# Patient Record
Sex: Female | Born: 1985 | Race: White | Hispanic: No | Marital: Single | Smoking: Never smoker
Health system: Southern US, Community
[De-identification: ages and names within clinical notes are randomized; demographics above are authoritative.]

## PROBLEM LIST (undated history)

## (undated) HISTORY — PX: CHOLECYSTECTOMY: SHX55

---

## 2011-04-03 ENCOUNTER — Emergency Department: Payer: Self-pay | Admitting: Emergency Medicine

## 2011-04-03 LAB — COMPREHENSIVE METABOLIC PANEL
Albumin: 4.2 g/dL (ref 3.4–5.0)
BUN: 15 mg/dL (ref 7–18)
Chloride: 105 mmol/L (ref 98–107)
Co2: 25 mmol/L (ref 21–32)
Creatinine: 0.71 mg/dL (ref 0.60–1.30)
EGFR (African American): >60
EGFR (Non-African Amer.): >60
Glucose: 92 mg/dL (ref 65–99)
Osmolality: 280 (ref 275–301)
SGOT(AST): 20 U/L (ref 15–37)
SGPT (ALT): 18 U/L
Total Protein: 7.8 g/dL (ref 6.4–8.2)

## 2011-04-03 LAB — CBC
HCT: 39.3 % (ref 35.0–47.0)
HGB: 13.2 g/dL (ref 12.0–16.0)
MCHC: 33.6 g/dL (ref 32.0–36.0)
Platelet: 199 10*3/uL (ref 150–440)
RBC: 4.94 10*6/uL (ref 3.80–5.20)

## 2011-04-03 LAB — TROPONIN I: Troponin-I: <0.02 ng/mL

## 2011-04-03 LAB — PREGNANCY, URINE: Pregnancy Test, Urine: NEGATIVE m[IU]/mL

## 2011-04-03 LAB — URINALYSIS, COMPLETE
Bilirubin,UR: NEGATIVE
Ketone: NEGATIVE
Nitrite: NEGATIVE
Ph: 5 (ref 4.5–8.0)
Protein: NEGATIVE
RBC,UR: NONE SEEN /HPF (ref 0–5)
Specific Gravity: 1.027 (ref 1.003–1.030)
WBC UR: NONE SEEN /HPF (ref 0–5)

## 2011-04-07 ENCOUNTER — Ambulatory Visit: Payer: Self-pay

## 2011-04-07 LAB — BASIC METABOLIC PANEL
Anion Gap: 9 (ref 7–16)
BUN: 12 mg/dL (ref 7–18)
Calcium, Total: 9.3 mg/dL (ref 8.5–10.1)
Chloride: 103 mmol/L (ref 98–107)
Co2: 28 mmol/L (ref 21–32)
Creatinine: 0.94 mg/dL (ref 0.60–1.30)
EGFR (African American): >60
EGFR (Non-African Amer.): >60
Glucose: 87 mg/dL (ref 65–99)
Osmolality: 279 (ref 275–301)
Potassium: 4.1 mmol/L (ref 3.5–5.1)
Sodium: 140 mmol/L (ref 136–145)

## 2011-04-22 ENCOUNTER — Ambulatory Visit: Payer: Self-pay | Admitting: Internal Medicine

## 2013-01-16 ENCOUNTER — Ambulatory Visit: Payer: Self-pay | Admitting: Physician Assistant

## 2018-02-08 DIAGNOSIS — R509 Fever, unspecified: Secondary | ICD-10-CM | POA: Diagnosis not present

## 2018-02-08 DIAGNOSIS — J101 Influenza due to other identified influenza virus with other respiratory manifestations: Secondary | ICD-10-CM | POA: Diagnosis not present

## 2018-02-11 DIAGNOSIS — B9789 Other viral agents as the cause of diseases classified elsewhere: Secondary | ICD-10-CM | POA: Diagnosis not present

## 2018-02-11 DIAGNOSIS — J069 Acute upper respiratory infection, unspecified: Secondary | ICD-10-CM | POA: Diagnosis not present

## 2018-02-19 DIAGNOSIS — Z8759 Personal history of other complications of pregnancy, childbirth and the puerperium: Secondary | ICD-10-CM | POA: Diagnosis not present

## 2018-02-19 DIAGNOSIS — Z3A01 Less than 8 weeks gestation of pregnancy: Secondary | ICD-10-CM | POA: Diagnosis not present

## 2018-02-19 DIAGNOSIS — O3680X0 Pregnancy with inconclusive fetal viability, not applicable or unspecified: Secondary | ICD-10-CM | POA: Diagnosis not present

## 2018-02-19 DIAGNOSIS — Z87891 Personal history of nicotine dependence: Secondary | ICD-10-CM | POA: Diagnosis not present

## 2018-03-10 DIAGNOSIS — F419 Anxiety disorder, unspecified: Secondary | ICD-10-CM | POA: Diagnosis not present

## 2018-03-10 DIAGNOSIS — S39011A Strain of muscle, fascia and tendon of abdomen, initial encounter: Secondary | ICD-10-CM | POA: Diagnosis not present

## 2018-03-19 DIAGNOSIS — O09299 Supervision of pregnancy with other poor reproductive or obstetric history, unspecified trimester: Secondary | ICD-10-CM | POA: Diagnosis not present

## 2018-03-19 DIAGNOSIS — Z3481 Encounter for supervision of other normal pregnancy, first trimester: Secondary | ICD-10-CM | POA: Diagnosis not present

## 2018-03-23 DIAGNOSIS — O285 Abnormal chromosomal and genetic finding on antenatal screening of mother: Secondary | ICD-10-CM | POA: Diagnosis not present

## 2018-03-24 DIAGNOSIS — Z36 Encounter for antenatal screening for chromosomal anomalies: Secondary | ICD-10-CM | POA: Diagnosis not present

## 2018-03-24 DIAGNOSIS — Z3A12 12 weeks gestation of pregnancy: Secondary | ICD-10-CM | POA: Diagnosis not present

## 2018-03-24 DIAGNOSIS — O09291 Supervision of pregnancy with other poor reproductive or obstetric history, first trimester: Secondary | ICD-10-CM | POA: Diagnosis not present

## 2018-04-07 DIAGNOSIS — F419 Anxiety disorder, unspecified: Secondary | ICD-10-CM | POA: Diagnosis not present

## 2018-05-05 DIAGNOSIS — Z363 Encounter for antenatal screening for malformations: Secondary | ICD-10-CM | POA: Diagnosis not present

## 2018-05-05 DIAGNOSIS — Z3482 Encounter for supervision of other normal pregnancy, second trimester: Secondary | ICD-10-CM | POA: Diagnosis not present

## 2018-07-12 DIAGNOSIS — Z23 Encounter for immunization: Secondary | ICD-10-CM | POA: Diagnosis not present

## 2018-07-12 DIAGNOSIS — Z3480 Encounter for supervision of other normal pregnancy, unspecified trimester: Secondary | ICD-10-CM | POA: Diagnosis not present

## 2018-07-29 DIAGNOSIS — D563 Thalassemia minor: Secondary | ICD-10-CM | POA: Diagnosis not present

## 2018-07-29 DIAGNOSIS — Z87891 Personal history of nicotine dependence: Secondary | ICD-10-CM | POA: Diagnosis not present

## 2018-07-29 DIAGNOSIS — O99013 Anemia complicating pregnancy, third trimester: Secondary | ICD-10-CM | POA: Diagnosis not present

## 2018-07-29 DIAGNOSIS — O26893 Other specified pregnancy related conditions, third trimester: Secondary | ICD-10-CM | POA: Diagnosis not present

## 2018-07-29 DIAGNOSIS — Z3A30 30 weeks gestation of pregnancy: Secondary | ICD-10-CM | POA: Diagnosis not present

## 2018-07-30 DIAGNOSIS — O26893 Other specified pregnancy related conditions, third trimester: Secondary | ICD-10-CM | POA: Diagnosis not present

## 2018-07-30 DIAGNOSIS — O99013 Anemia complicating pregnancy, third trimester: Secondary | ICD-10-CM | POA: Diagnosis not present

## 2018-07-30 DIAGNOSIS — Z3A30 30 weeks gestation of pregnancy: Secondary | ICD-10-CM | POA: Diagnosis not present

## 2018-07-30 DIAGNOSIS — Z87891 Personal history of nicotine dependence: Secondary | ICD-10-CM | POA: Diagnosis not present

## 2018-07-30 DIAGNOSIS — O4703 False labor before 37 completed weeks of gestation, third trimester: Secondary | ICD-10-CM | POA: Diagnosis not present

## 2018-07-30 DIAGNOSIS — D563 Thalassemia minor: Secondary | ICD-10-CM | POA: Diagnosis not present

## 2018-09-09 DIAGNOSIS — O26843 Uterine size-date discrepancy, third trimester: Secondary | ICD-10-CM | POA: Diagnosis not present

## 2018-09-14 DIAGNOSIS — Z3483 Encounter for supervision of other normal pregnancy, third trimester: Secondary | ICD-10-CM | POA: Diagnosis not present

## 2018-09-15 DIAGNOSIS — Z3A37 37 weeks gestation of pregnancy: Secondary | ICD-10-CM | POA: Diagnosis not present

## 2018-09-20 DIAGNOSIS — Z23 Encounter for immunization: Secondary | ICD-10-CM | POA: Diagnosis not present

## 2018-09-28 DIAGNOSIS — Z20828 Contact with and (suspected) exposure to other viral communicable diseases: Secondary | ICD-10-CM | POA: Diagnosis not present

## 2018-09-28 DIAGNOSIS — Z1159 Encounter for screening for other viral diseases: Secondary | ICD-10-CM | POA: Diagnosis not present

## 2018-09-28 DIAGNOSIS — Z01812 Encounter for preprocedural laboratory examination: Secondary | ICD-10-CM | POA: Diagnosis not present

## 2018-10-01 DIAGNOSIS — Z3A39 39 weeks gestation of pregnancy: Secondary | ICD-10-CM | POA: Diagnosis not present

## 2018-10-06 DIAGNOSIS — F410 Panic disorder [episodic paroxysmal anxiety] without agoraphobia: Secondary | ICD-10-CM | POA: Diagnosis not present

## 2018-10-14 DIAGNOSIS — F419 Anxiety disorder, unspecified: Secondary | ICD-10-CM | POA: Diagnosis not present

## 2018-10-14 DIAGNOSIS — O864 Pyrexia of unknown origin following delivery: Secondary | ICD-10-CM | POA: Diagnosis not present

## 2018-10-15 DIAGNOSIS — F419 Anxiety disorder, unspecified: Secondary | ICD-10-CM | POA: Diagnosis not present

## 2018-10-15 DIAGNOSIS — O8612 Endometritis following delivery: Secondary | ICD-10-CM | POA: Diagnosis not present

## 2018-10-16 DIAGNOSIS — F419 Anxiety disorder, unspecified: Secondary | ICD-10-CM | POA: Diagnosis not present

## 2018-10-16 DIAGNOSIS — O8612 Endometritis following delivery: Secondary | ICD-10-CM | POA: Diagnosis not present

## 2019-04-08 DIAGNOSIS — M545 Low back pain: Secondary | ICD-10-CM | POA: Diagnosis not present

## 2019-10-15 ENCOUNTER — Ambulatory Visit
Admission: EM | Admit: 2019-10-15 | Discharge: 2019-10-15 | Disposition: A | Discharge status: Home / Self Care | Payer: Medicaid Other | Attending: Family Medicine | Admitting: Family Medicine

## 2019-10-15 ENCOUNTER — Ambulatory Visit (INDEPENDENT_AMBULATORY_CARE_PROVIDER_SITE_OTHER): Payer: Medicaid Other

## 2019-10-15 ENCOUNTER — Other Ambulatory Visit: Payer: Self-pay

## 2019-10-15 DIAGNOSIS — S8992XA Unspecified injury of left lower leg, initial encounter: Secondary | ICD-10-CM

## 2019-10-15 DIAGNOSIS — M25562 Pain in left knee: Secondary | ICD-10-CM | POA: Diagnosis not present

## 2019-10-15 MED ORDER — MELOXICAM 15 MG PO TABS: 15.0000 mg | ORAL_TABLET | Freq: Every day | ORAL | 0 refills | Status: DC | PRN

## 2019-10-15 NOTE — Discharge Instructions (Signed)
Rest, ice, elevation.  Medication as directed.  Please call [facility] clinic Orthopedics [phone]) OR [facility] [phone]) for an appt if you fail to improve or worsen. May need MRI.  Take care  Dr. Adriana Simas

## 2019-10-15 NOTE — ED Provider Notes (Signed)
[facility]    CSN: [id number] Arrival date & time: 10/15/19  1518  History   Chief Complaint Chief Complaint  Patient presents with  . Knee Pain    left   HPI  34 year old female presents with left knee pain.  Patient states that she was at a trampoline park today with her children. Was jumping and came down forcefully and heard a pop of the left knee. Then developed pain (diffuse) and difficulty bearing weight. Patient reports feeling as if knee is unstable. No relieving factors. Pain 6/10 in severity. No other associated symptoms.   Home Medications    Prior to Admission medications   Medication Sig Start Date End Date Taking? Authorizing Provider  sertraline (ZOLOFT) 100 MG tablet Take 100 mg by mouth daily. 10/05/19  Yes [provider]  meloxicam (MOBIC) 15 MG tablet Take 1 tablet (15 mg total) by mouth daily as needed. 10/15/19   Tommie Sams, DO    Family History Family History  Problem Relation Age of Onset  . Rheum arthritis Mother   . Fibromyalgia Mother   . Anxiety disorder Mother   . Healthy Father     Social History Social History   Tobacco Use  . Smoking status: Never Smoker  . Smokeless tobacco: Never Used  Vaping Use  . Vaping Use: Never used  Substance Use Topics  . Alcohol use: Yes    Comment: socially  . Drug use: Never     Allergies   Patient has no known allergies.   Review of Systems Review of Systems  Musculoskeletal:       Left knee pain.   Physical Exam Triage Vital Signs ED Triage Vitals  Enc Vitals Group     BP 10/15/19 1537 118/79     Pulse Rate 10/15/19 1537 76     Resp 10/15/19 1537 16     Temp 10/15/19 1537 98.2 F (36.8 C)     Temp Source 10/15/19 1537 Oral     SpO2 10/15/19 1537 100 %     Weight 10/15/19 1535 185 lb (83.9 kg)     Height 10/15/19 1535 5\' 4"  (1.626 m)     Head Circumference --      Peak Flow --      Pain Score 10/15/19 1535 6     Pain Loc --      Pain Edu? --      Excl.  in [place]? --    Updated Vital Signs BP 118/79 (BP Location: Right Arm)   Pulse 76   Temp 98.2 F (36.8 C) (Oral)   Resp 16   Ht 5\' 4"  (1.626 m)   Wt 83.9 kg   LMP 10/14/2019   SpO2 100%   BMI 31.76 kg/m   Visual Acuity Right Eye Distance:   Left Eye Distance:   Bilateral Distance:    Right Eye Near:   Left Eye Near:    Bilateral Near:     Physical Exam Vitals and nursing note reviewed.  Constitutional:      General: She is not in acute distress.    Appearance: Normal appearance. She is not ill-appearing.  HENT:     Head: Normocephalic and atraumatic.  Eyes:     General:        Right eye: No discharge.        Left eye: No discharge.     Conjunctiva/sclera: Conjunctivae normal.  Pulmonary:     Effort: Pulmonary effort is normal. No  respiratory distress.  Musculoskeletal:     Comments: Left knee - no appreciable swelling. No joint line tenderness. ? Mild lateral tenderness.   Neurological:     Mental Status: She is alert.  Psychiatric:        Mood and Affect: Mood normal.        Behavior: Behavior normal.    UC Treatments / Results  Labs (all labs ordered are listed, but only abnormal results are displayed) Labs Reviewed - No data to display  EKG   Radiology DG Knee Complete 4 Views Left  Result Date: 10/15/2019 CLINICAL DATA:  Knee pain following injury on trampoline today. Difficulty bearing weight. EXAM: LEFT KNEE - COMPLETE 4+ VIEW COMPARISON:  None. FINDINGS: The mineralization and alignment are normal. There is no evidence of acute fracture or dislocation. The joint spaces are preserved. No significant joint effusion or focal soft tissue abnormality. IMPRESSION: Normal examination. Electronically Signed   By: Carey Bullocks M.D.   On: 10/15/2019 16:37    Procedures Procedures (including critical care time)  Medications Ordered in UC Medications - No data to display  Initial Impression / Assessment and Plan / UC Course  I have reviewed the triage  vital signs and the nursing notes.  Pertinent labs & imaging results that were available during my care of the patient were reviewed by me and considered in my medical decision making (see chart for details).    34 year old female presents with a knee injury. Xray obtained and independently reviewed by me. Interpretation: No fracture. Advised rest, ice, elevation. Mobic as directed. May need MRI. Information for ortho given.   Final Clinical Impressions(s) / UC Diagnoses   Final diagnoses:  Acute pain of left knee     Discharge Instructions     Rest, ice, elevation.  Medication as directed.  Please call [facility] clinic Orthopedics [phone]) OR [facility] [phone]) for an appt if you fail to improve or worsen. May need MRI.  Take care  Dr. Adriana Simas     ED Prescriptions    Medication Sig Dispense Auth. Provider   meloxicam (MOBIC) 15 MG tablet Take 1 tablet (15 mg total) by mouth daily as needed. 30 tablet Tommie Sams, DO     PDMP not reviewed this encounter.   Tommie Sams, [place] 10/15/19 2156

## 2019-10-15 NOTE — ED Triage Notes (Signed)
Patient complains of left knee and leg pain that started a few hours ago while jumping at the trampoline park. Patient states that she cannot bear weight on her left knee. States that she feels like this is unstable.

## 2019-10-17 DIAGNOSIS — S8002XA Contusion of left knee, initial encounter: Secondary | ICD-10-CM | POA: Diagnosis not present

## 2019-10-17 DIAGNOSIS — S83512A Sprain of anterior cruciate ligament of left knee, initial encounter: Secondary | ICD-10-CM | POA: Diagnosis not present

## 2019-10-22 DIAGNOSIS — S8002XA Contusion of left knee, initial encounter: Secondary | ICD-10-CM | POA: Diagnosis not present

## 2019-10-22 DIAGNOSIS — S83512A Sprain of anterior cruciate ligament of left knee, initial encounter: Secondary | ICD-10-CM | POA: Diagnosis not present

## 2019-10-26 DIAGNOSIS — S83512A Sprain of anterior cruciate ligament of left knee, initial encounter: Secondary | ICD-10-CM | POA: Diagnosis not present

## 2019-10-31 DIAGNOSIS — S83511D Sprain of anterior cruciate ligament of right knee, subsequent encounter: Secondary | ICD-10-CM | POA: Diagnosis not present

## 2019-10-31 DIAGNOSIS — S83242D Other tear of medial meniscus, current injury, left knee, subsequent encounter: Secondary | ICD-10-CM | POA: Diagnosis not present

## 2019-11-02 DIAGNOSIS — F419 Anxiety disorder, unspecified: Secondary | ICD-10-CM | POA: Diagnosis not present

## 2019-11-08 DIAGNOSIS — S83242D Other tear of medial meniscus, current injury, left knee, subsequent encounter: Secondary | ICD-10-CM | POA: Diagnosis not present

## 2019-11-08 DIAGNOSIS — S83511D Sprain of anterior cruciate ligament of right knee, subsequent encounter: Secondary | ICD-10-CM | POA: Diagnosis not present

## 2020-02-08 DIAGNOSIS — Z1152 Encounter for screening for COVID-19: Secondary | ICD-10-CM | POA: Diagnosis not present

## 2020-02-09 DIAGNOSIS — B9689 Other specified bacterial agents as the cause of diseases classified elsewhere: Secondary | ICD-10-CM | POA: Diagnosis not present

## 2020-02-09 DIAGNOSIS — N898 Other specified noninflammatory disorders of vagina: Secondary | ICD-10-CM | POA: Diagnosis not present

## 2020-02-09 DIAGNOSIS — R1032 Left lower quadrant pain: Secondary | ICD-10-CM | POA: Diagnosis not present

## 2020-02-09 DIAGNOSIS — R109 Unspecified abdominal pain: Secondary | ICD-10-CM | POA: Diagnosis not present

## 2020-02-09 DIAGNOSIS — N760 Acute vaginitis: Secondary | ICD-10-CM | POA: Diagnosis not present

## 2020-02-09 DIAGNOSIS — R1031 Right lower quadrant pain: Secondary | ICD-10-CM | POA: Diagnosis not present

## 2020-05-04 DIAGNOSIS — O3680X0 Pregnancy with inconclusive fetal viability, not applicable or unspecified: Secondary | ICD-10-CM | POA: Diagnosis not present

## 2020-05-04 DIAGNOSIS — Z3A10 10 weeks gestation of pregnancy: Secondary | ICD-10-CM | POA: Diagnosis not present

## 2020-07-06 DIAGNOSIS — Z3A19 19 weeks gestation of pregnancy: Secondary | ICD-10-CM | POA: Diagnosis not present

## 2020-07-06 DIAGNOSIS — Z3689 Encounter for other specified antenatal screening: Secondary | ICD-10-CM | POA: Diagnosis not present

## 2020-08-02 DIAGNOSIS — Z3480 Encounter for supervision of other normal pregnancy, unspecified trimester: Secondary | ICD-10-CM | POA: Diagnosis not present

## 2020-10-09 DIAGNOSIS — Z23 Encounter for immunization: Secondary | ICD-10-CM | POA: Diagnosis not present

## 2020-10-14 DIAGNOSIS — R059 Cough, unspecified: Secondary | ICD-10-CM | POA: Diagnosis not present

## 2020-10-14 DIAGNOSIS — J069 Acute upper respiratory infection, unspecified: Secondary | ICD-10-CM | POA: Diagnosis not present

## 2020-10-21 ENCOUNTER — Other Ambulatory Visit: Payer: Self-pay

## 2020-10-21 ENCOUNTER — Ambulatory Visit
Admission: EM | Admit: 2020-10-21 | Discharge: 2020-10-21 | Disposition: A | Discharge status: Home / Self Care | Payer: Medicaid Other

## 2020-10-21 DIAGNOSIS — J0100 Acute maxillary sinusitis, unspecified: Secondary | ICD-10-CM

## 2020-10-21 DIAGNOSIS — H66003 Acute suppurative otitis media without spontaneous rupture of ear drum, bilateral: Secondary | ICD-10-CM | POA: Diagnosis not present

## 2020-10-21 MED ORDER — FLUTICASONE PROPIONATE 50 MCG/ACT NA SUSP: 2.0000 | Freq: Every day | NASAL | 0 refills | Status: DC

## 2020-10-21 MED ORDER — AMOXICILLIN 500 MG PO CAPS: 500.0000 mg | ORAL_CAPSULE | Freq: Two times a day (BID) | ORAL | 0 refills | Status: AC

## 2020-10-21 NOTE — ED Triage Notes (Signed)
Pt here with C/O nasal congestion for over 1 week, went to [facility] last week and was told to try Mucinex, nasal sprays, pt states she has got worst. Was tested for Flu and Covid and  was negative . Pt is [redacted] weeks pregnant.,

## 2020-10-21 NOTE — ED Provider Notes (Signed)
[facility]    CSN: [id number] Arrival date & time: 10/21/20  0941      History   Chief Complaint Chief Complaint  Patient presents with   Nasal Congestion    HPI Jasmine Ballard is a 35 y.o. female.   HPI  Nasal Congestion: Pt has had nasal congestion for the past week. She went to [facility] last week where she tested negative for Flu and COVID. She reports that symptoms have worsened and failed to improve with mucinex, flonase. She is concerned that she has a sinus infection and needs and antibiotics. She denies dental pain, fevers but has had increasing pressure, ear pain and bloody nasal discharge.   History reviewed. No pertinent past medical history.  There are no problems to display for this patient.   History reviewed. No pertinent surgical history.  OB History     Gravida  1   Para      Term      Preterm      AB      Living         SAB      IAB      Ectopic      Multiple      Live Births               Home Medications    Prior to Admission medications   Medication Sig Start Date End Date Taking? Authorizing Provider  ASPIRIN 81 PO Take by mouth.   Yes [provider]  cetirizine (ZYRTEC) 10 MG tablet Take by mouth.   Yes [provider]  ferrous sulfate 325 (65 FE) MG tablet Take by mouth.   Yes [provider]  Prenatal Vit-Fe Fumarate-FA (PRENATAL VITAMINS PO) Take by mouth.   Yes [provider]  sertraline (ZOLOFT) 100 MG tablet Take 100 mg by mouth daily. 10/05/19  Yes [provider]  meloxicam (MOBIC) 15 MG tablet Take 1 tablet (15 mg total) by mouth daily as needed. 10/15/19   Tommie Sams, DO    Family History Family History  Problem Relation Age of Onset   Rheum arthritis Mother    Fibromyalgia Mother    Anxiety disorder Mother    Healthy Father     Social History Social History   Tobacco Use   Smoking status: Never   Smokeless tobacco: Never   Vaping Use   Vaping Use: Never used  Substance Use Topics   Alcohol use: Yes    Comment: socially   Drug use: Never     Allergies   Patient has no known allergies.   Review of Systems Review of Systems  As stated above in HPI Physical Exam Triage Vital Signs ED Triage Vitals  Enc Vitals Group     BP 10/21/20 0956 118/68     Pulse Rate 10/21/20 0956 73     Resp 10/21/20 0956 18     Temp 10/21/20 0956 98.1 F (36.7 C)     Temp Source 10/21/20 0956 Oral     SpO2 10/21/20 0956 99 %     Weight --      Height 10/21/20 0954 5\' 4"  (1.626 m)     Head Circumference --      Peak Flow --      Pain Score 10/21/20 0953 0     Pain Loc --      Pain Edu? --      Excl. in GC? --  No data found.  Updated Vital Signs BP 118/68 (BP Location: Left Arm)   Pulse 73   Temp 98.1 F (36.7 C) (Oral)   Resp 18   Ht 5\' 4"  (1.626 m)   SpO2 99%   BMI 31.76 kg/m   Physical Exam Vitals and nursing note reviewed.  Constitutional:      General: She is not in acute distress.    Appearance: Normal appearance. She is not ill-appearing, toxic-appearing or diaphoretic.  HENT:     Head: Normocephalic and atraumatic.     Ears:     Comments: Erythema and edema of bilateral TMs    Nose: Congestion (maxillary bogginess) present.     Mouth/Throat:     Mouth: Mucous membranes are moist.     Pharynx: Oropharynx is clear. No oropharyngeal exudate or posterior oropharyngeal erythema.  Eyes:     Extraocular Movements: Extraocular movements intact.     Pupils: Pupils are equal, round, and reactive to light.  Cardiovascular:     Rate and Rhythm: Normal rate and regular rhythm.     Heart sounds: Normal heart sounds.  Pulmonary:     Effort: Pulmonary effort is normal.     Breath sounds: Normal breath sounds.  Musculoskeletal:     Cervical back: Normal range of motion and neck supple.  Lymphadenopathy:     Cervical: No cervical adenopathy.  Skin:    General: Skin is warm.  Neurological:      Mental Status: She is alert and oriented to person, place, and time.     UC Treatments / Results  Labs (all labs ordered are listed, but only abnormal results are displayed) Labs Reviewed - No data to display  EKG   Radiology No results found.  Procedures Procedures (including critical care time)  Medications Ordered in UC Medications - No data to display  Initial Impression / Assessment and Plan / UC Course  I have reviewed the triage vital signs and the nursing notes.  Pertinent labs & imaging results that were available during my care of the patient were reviewed by me and considered in my medical decision making (see chart for details).     New. Treating with Amoxil and flonase along with neti pot. Discussed. Follow up PRN.  Final Clinical Impressions(s) / UC Diagnoses   Final diagnoses:  None   Discharge Instructions   None    ED Prescriptions   None    PDMP not reviewed this encounter.   , Rushie Chestnut 10/21/20 1021

## 2020-10-23 DIAGNOSIS — O99210 Obesity complicating pregnancy, unspecified trimester: Secondary | ICD-10-CM | POA: Diagnosis not present

## 2020-10-23 DIAGNOSIS — Z3A34 34 weeks gestation of pregnancy: Secondary | ICD-10-CM | POA: Diagnosis not present

## 2020-10-23 DIAGNOSIS — E669 Obesity, unspecified: Secondary | ICD-10-CM | POA: Diagnosis not present

## 2020-10-23 DIAGNOSIS — O26843 Uterine size-date discrepancy, third trimester: Secondary | ICD-10-CM | POA: Diagnosis not present

## 2020-11-04 DIAGNOSIS — Z3A36 36 weeks gestation of pregnancy: Secondary | ICD-10-CM | POA: Diagnosis not present

## 2020-11-12 DIAGNOSIS — O99891 Other specified diseases and conditions complicating pregnancy: Secondary | ICD-10-CM | POA: Diagnosis not present

## 2020-11-12 DIAGNOSIS — R519 Headache, unspecified: Secondary | ICD-10-CM | POA: Diagnosis not present

## 2020-11-12 DIAGNOSIS — Z3A37 37 weeks gestation of pregnancy: Secondary | ICD-10-CM | POA: Diagnosis not present

## 2020-11-20 DIAGNOSIS — F419 Anxiety disorder, unspecified: Secondary | ICD-10-CM | POA: Diagnosis not present

## 2020-11-20 DIAGNOSIS — Z3A38 38 weeks gestation of pregnancy: Secondary | ICD-10-CM | POA: Diagnosis not present

## 2020-11-20 DIAGNOSIS — Z7982 Long term (current) use of aspirin: Secondary | ICD-10-CM | POA: Diagnosis not present

## 2020-11-20 DIAGNOSIS — Z20822 Contact with and (suspected) exposure to covid-19: Secondary | ICD-10-CM | POA: Diagnosis not present

## 2020-11-20 DIAGNOSIS — Z79899 Other long term (current) drug therapy: Secondary | ICD-10-CM | POA: Diagnosis not present

## 2020-11-20 DIAGNOSIS — D560 Alpha thalassemia: Secondary | ICD-10-CM | POA: Diagnosis not present

## 2020-11-20 DIAGNOSIS — Z30430 Encounter for insertion of intrauterine contraceptive device: Secondary | ICD-10-CM | POA: Diagnosis not present

## 2020-11-20 DIAGNOSIS — Z87891 Personal history of nicotine dependence: Secondary | ICD-10-CM | POA: Diagnosis not present

## 2020-11-20 DIAGNOSIS — D563 Thalassemia minor: Secondary | ICD-10-CM | POA: Diagnosis not present

## 2020-11-20 DIAGNOSIS — O9902 Anemia complicating childbirth: Secondary | ICD-10-CM | POA: Diagnosis not present

## 2020-11-20 DIAGNOSIS — O99344 Other mental disorders complicating childbirth: Secondary | ICD-10-CM | POA: Diagnosis not present

## 2020-11-23 ENCOUNTER — Telehealth: Payer: Self-pay

## 2020-11-23 NOTE — Telephone Encounter (Signed)
Transition Care Management Unsuccessful Follow-up Telephone Call  Date of discharge and from where:  11/22/2020 from [facility]  Attempts:  1st Attempt  Reason for unsuccessful TCM follow-up call:  Left voice message

## 2020-11-26 NOTE — Telephone Encounter (Signed)
Transition Care Management Follow-up Telephone Call Date of discharge and from where: 11/22/2020 from [facility] How have you been since you were released from the hospital? Pt stated that she is feeling well and did not have any questions or concerns at this time.  Any questions or concerns? No  Items Reviewed: Did the pt receive and understand the discharge instructions provided? Yes  Medications obtained and verified? Yes  Other? No  Any new allergies since your discharge? No  Dietary orders reviewed? No Do you have support at home? Yes   Functional Questionnaire: (I = Independent and D = Dependent) ADLs: I  Bathing/Dressing- I  Meal Prep- I  Eating- I  Maintaining continence- I  Transferring/Ambulation- I  Managing Meds- I   Follow up appointments reviewed:  PCP Hospital f/u appt confirmed? No   Specialist Hospital f/u appt confirmed? Yes  Scheduled to see [facility] in [place] on 12/03/2020 @ 9:00am. Are transportation arrangements needed? No  If their condition worsens, is the pt aware to call PCP or go to the Emergency Dept.? Yes Was the patient provided with contact information for the PCP's office or ED? Yes Was to pt encouraged to call back with questions or concerns? Yes

## 2020-12-21 DIAGNOSIS — Z1332 Encounter for screening for maternal depression: Secondary | ICD-10-CM | POA: Diagnosis not present

## 2021-05-10 IMAGING — CR DG KNEE COMPLETE 4+V*L*
5 series · 5 of 5 positions shown · non-contrast
Comparison: None.

CLINICAL DATA: Knee pain following injury on trampoline today.
Difficulty bearing weight.

EXAM:
LEFT KNEE - COMPLETE 4+ VIEW

[knee ap]
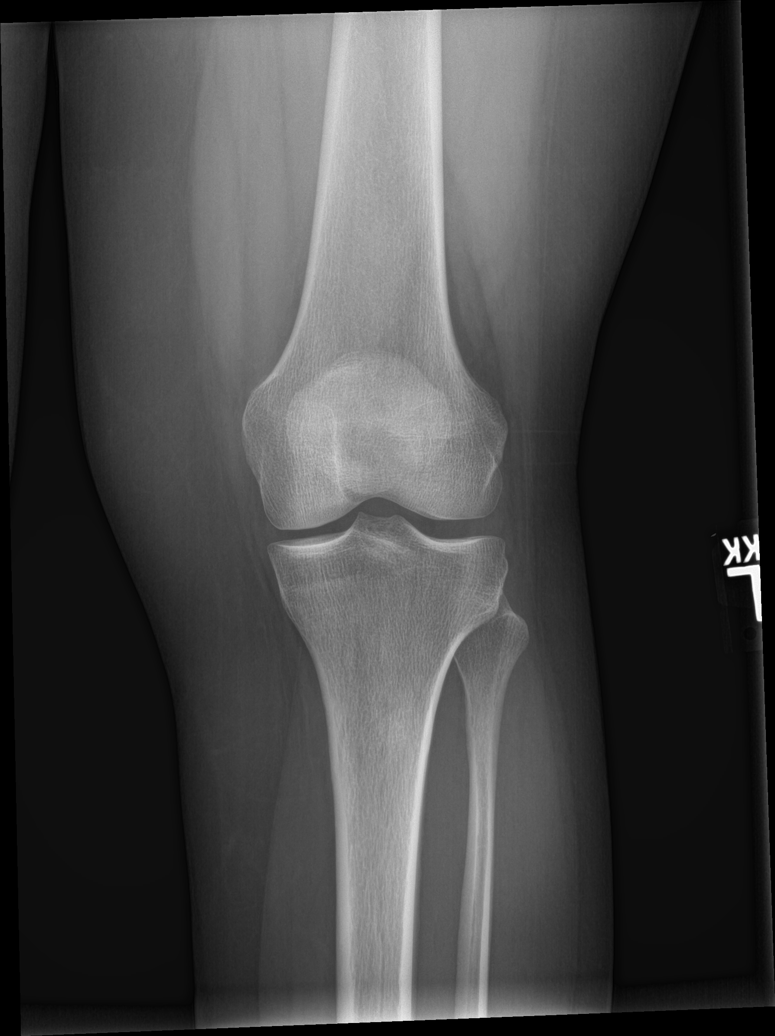

[knee lat]
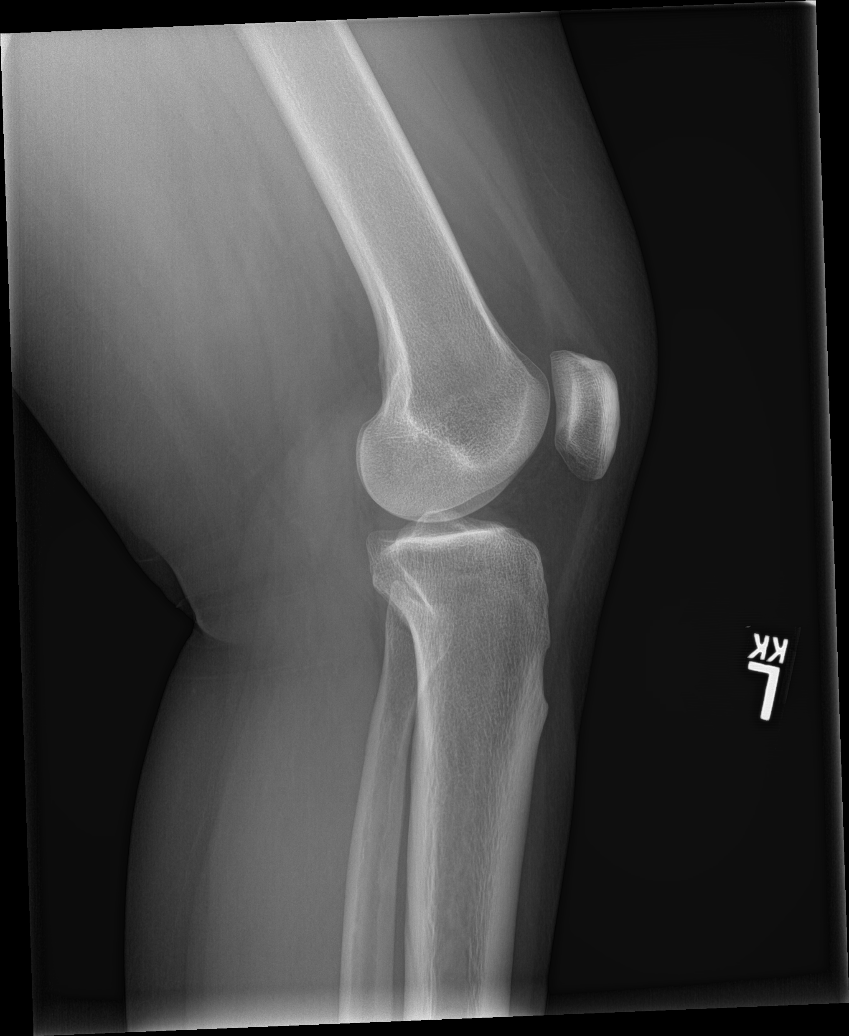

[tunnel (1 of 2)]
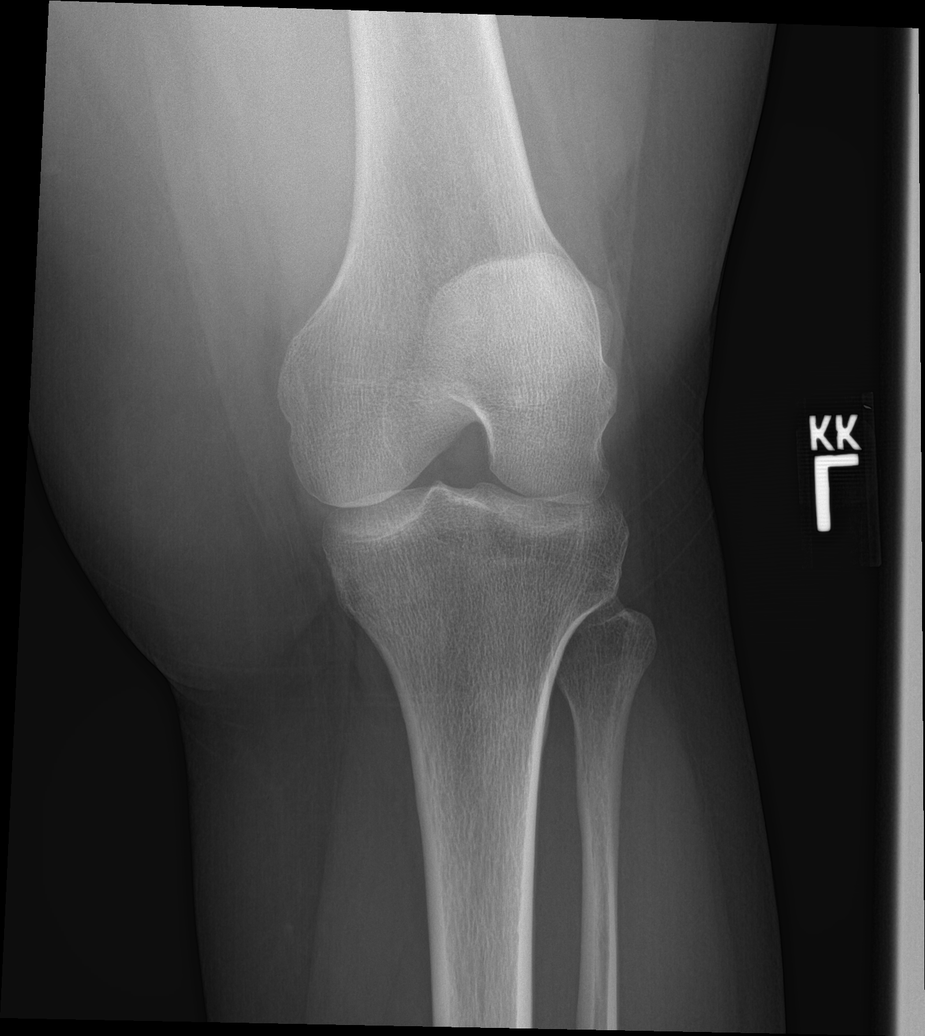

[tunnel (2 of 2)]
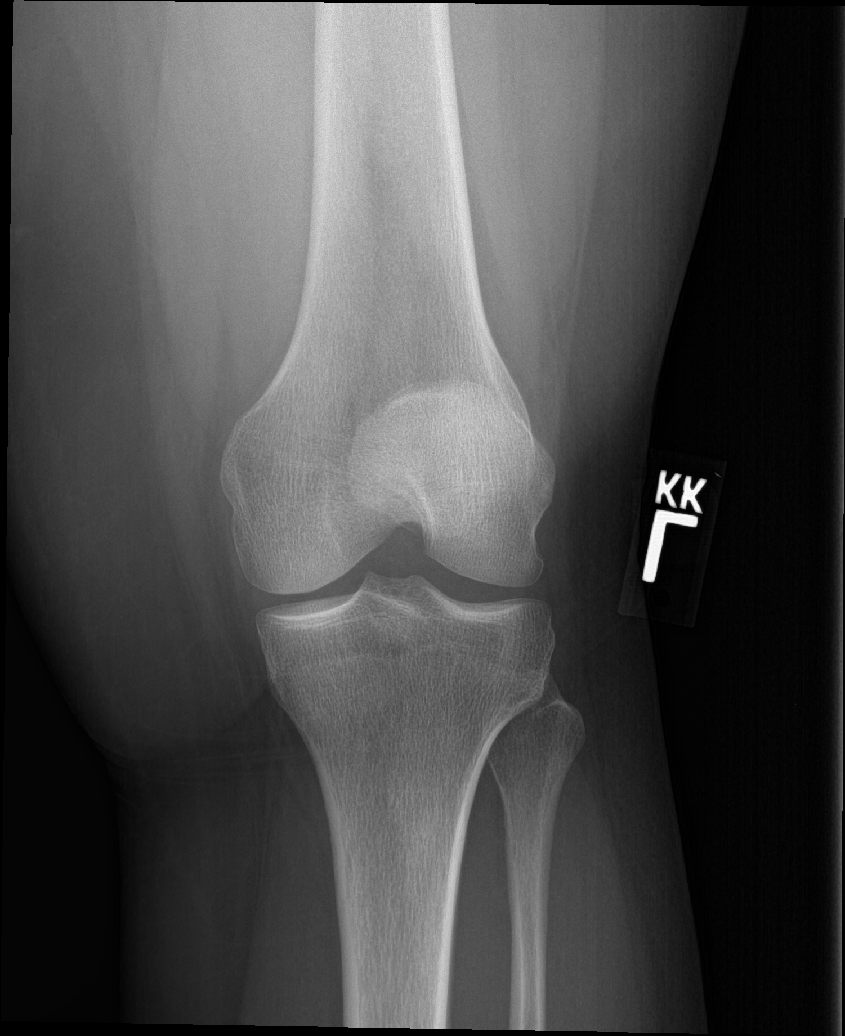

[patella skyline]
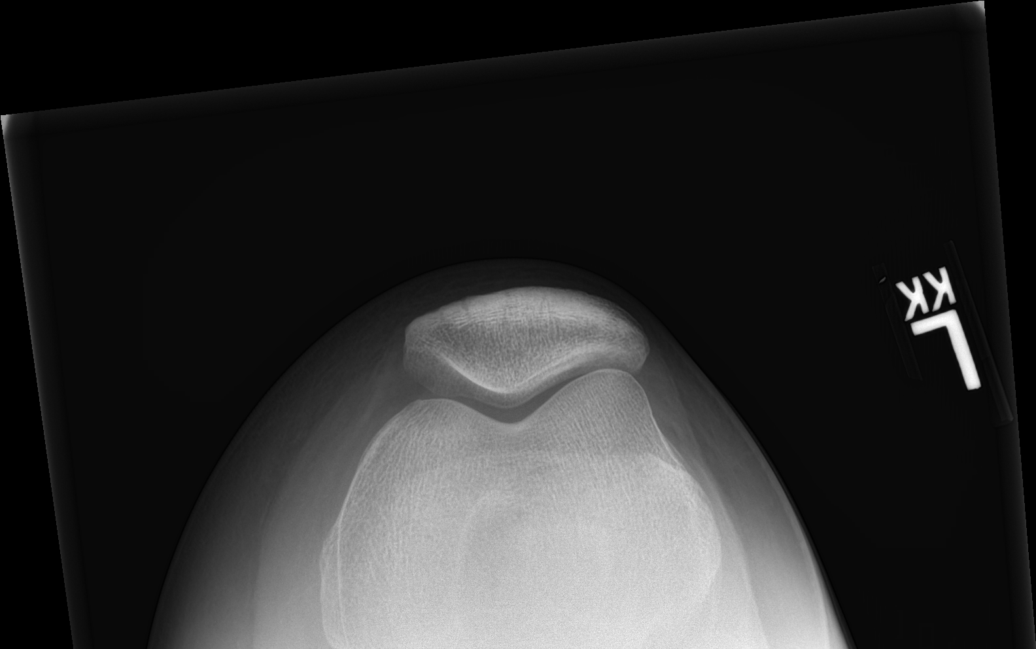

[5 of 5 positions shown; findings below may reference images not displayed]

FINDINGS: The mineralization and alignment are normal. There is no evidence of
acute fracture or dislocation. The joint spaces are preserved. No
significant joint effusion or focal soft tissue abnormality.
IMPRESSION: Normal examination.

## 2021-06-18 DIAGNOSIS — R1011 Right upper quadrant pain: Secondary | ICD-10-CM | POA: Diagnosis not present

## 2021-06-18 DIAGNOSIS — R1084 Generalized abdominal pain: Secondary | ICD-10-CM | POA: Diagnosis not present

## 2021-06-18 DIAGNOSIS — S39012A Strain of muscle, fascia and tendon of lower back, initial encounter: Secondary | ICD-10-CM | POA: Diagnosis not present

## 2021-06-18 DIAGNOSIS — L299 Pruritus, unspecified: Secondary | ICD-10-CM | POA: Diagnosis not present

## 2022-02-28 ENCOUNTER — Ambulatory Visit (INDEPENDENT_AMBULATORY_CARE_PROVIDER_SITE_OTHER): Payer: Medicaid Other

## 2022-02-28 ENCOUNTER — Ambulatory Visit
Admission: EM | Admit: 2022-02-28 | Discharge: 2022-02-28 | Disposition: A | Discharge status: Home / Self Care | Payer: Medicaid Other | Attending: Emergency Medicine | Admitting: Emergency Medicine

## 2022-02-28 DIAGNOSIS — S99911A Unspecified injury of right ankle, initial encounter: Secondary | ICD-10-CM | POA: Diagnosis not present

## 2022-02-28 DIAGNOSIS — S93601A Unspecified sprain of right foot, initial encounter: Secondary | ICD-10-CM | POA: Diagnosis not present

## 2022-02-28 DIAGNOSIS — S93401A Sprain of unspecified ligament of right ankle, initial encounter: Secondary | ICD-10-CM | POA: Diagnosis not present

## 2022-02-28 NOTE — ED Provider Notes (Signed)
[facility]    CSN: [id number] Arrival date & time: 02/28/22  0844      History   Chief Complaint Chief Complaint  Patient presents with   Ankle Pain   Foot Pain    HPI HEATER OSUCH is a 37 y.o. female.   HPI  37 year old female here for evaluation of right foot and ankle pain.  The patient reports that she was walking across a parking lot last night and stepped in a hole which caused her to roll her foot inwards.  She states that she did not fall and she was able to continue walking but with pain.  She is able to ambulate but she has pain in her midfoot, arch of her foot, and on both sides of her ankle.  She also endorses numbness and tingling in her toes.  There is mild swelling but no bruising or redness.  History reviewed. No pertinent past medical history.  There are no problems to display for this patient.   History reviewed. No pertinent surgical history.  OB History     Gravida  1   Para      Term      Preterm      AB      Living         SAB      IAB      Ectopic      Multiple      Live Births               Home Medications    Prior to Admission medications   Medication Sig Start Date End Date Taking? Authorizing Provider  cetirizine (ZYRTEC) 10 MG tablet Take by mouth.   Yes [provider]    Family History Family History  Problem Relation Age of Onset   Rheum arthritis Mother    Fibromyalgia Mother    Anxiety disorder Mother    Healthy Father     Social History Social History   Tobacco Use   Smoking status: Never   Smokeless tobacco: Never  Vaping Use   Vaping Use: Never used  Substance Use Topics   Alcohol use: Yes    Comment: socially   Drug use: Never     Allergies   Patient has no known allergies.   Review of Systems Review of Systems  Constitutional:  Negative for fever.  Musculoskeletal:  Positive for arthralgias and joint swelling.  Skin:  Negative for color change.   Neurological:  Positive for numbness. Negative for weakness.     Physical Exam Triage Vital Signs ED Triage Vitals  Enc Vitals Group     BP      Pulse      Resp      Temp      Temp src      SpO2      Weight      Height      Head Circumference      Peak Flow      Pain Score      Pain Loc      Pain Edu?      Excl. in [place]?    No data found.  Updated Vital Signs BP 117/82 (BP Location: Left Arm)   Pulse 66   Temp 98 F (36.7 C) (Oral)   Ht '5\' 4"'$  (1.626 m)   Wt 180 lb (81.6 kg)   SpO2 100%   Breastfeeding No   BMI 30.90 kg/m  Visual Acuity Right Eye Distance:   Left Eye Distance:   Bilateral Distance:    Right Eye Near:   Left Eye Near:    Bilateral Near:     Physical Exam Vitals and nursing note reviewed.  Constitutional:      Appearance: Normal appearance. She is not ill-appearing.  HENT:     Head: Normocephalic and atraumatic.  Musculoskeletal:        General: Swelling, tenderness and signs of injury present. No deformity. Normal range of motion.  Skin:    General: Skin is warm and dry.     Capillary Refill: Capillary refill takes less than 2 seconds.     Findings: No bruising or erythema.  Neurological:     General: No focal deficit present.     Mental Status: She is alert and oriented to person, place, and time.  Psychiatric:        Mood and Affect: Mood normal.        Behavior: Behavior normal.        Thought Content: Thought content normal.        Judgment: Judgment normal.      UC Treatments / Results  Labs (all labs ordered are listed, but only abnormal results are displayed) Labs Reviewed - No data to display  EKG   Radiology DG Ankle Complete Right  Result Date: 02/28/2022 CLINICAL DATA:  37 year old female status post fall last night, stepped in hole. Twisting injury. Pain. EXAM: RIGHT ANKLE - COMPLETE 3+ VIEW COMPARISON:  Right foot series today. FINDINGS: Bone mineralization is within normal limits. There is no evidence of  fracture, dislocation, or joint effusion. There is no evidence of arthropathy or other focal bone abnormality. Soft tissues are unremarkable. IMPRESSION: Negative. Electronically Signed   By: Jasmine Ballard M.D.   On: 02/28/2022 09:46   DG Foot Complete Right  Result Date: 02/28/2022 CLINICAL DATA:  37 year old female status post fall last night, stepped in hole. Twisting injury. Pain. EXAM: RIGHT FOOT COMPLETE - 3+ VIEW COMPARISON:  None Available. FINDINGS: Bone mineralization is within normal limits. There is no evidence of fracture or dislocation. Accessory ossicle adjacent to the cuboid. There is no evidence of arthropathy or other focal bone abnormality. Soft tissues are unremarkable. IMPRESSION: No acute fracture or dislocation identified about the right foot. Electronically Signed   By: Jasmine Ballard M.D.   On: 02/28/2022 09:45    Procedures Procedures (including critical care time)  Medications Ordered in UC Medications - No data to display  Initial Impression / Assessment and Plan / UC Course  I have reviewed the triage vital signs and the nursing notes.  Pertinent labs & imaging results that were available during my care of the patient were reviewed by me and considered in my medical decision making (see chart for details).   Patient is a pleasant, nontoxic-appearing 37 year old female here for evaluation of pain and swelling to her right foot and ankle.  This occurred as a result of stepping in a hole and inverting her foot last night.  She is able to bear weight but it causes pain and she is describing pain in the arch of her foot, and her midfoot, and both sides of her ankle.  There is no bony tenderness with palpation of the medial or lateral malleolus but there is pain with palpation of the soft tissues underneath.  Also with palpation of the arch and across the bones of the midfoot.  No pain with palpation of the  first through fourth metatarsals but the fifth metatarsal does have tenderness when  palpating over the base.  Phalanges are unremarkable.  No ecchymosis or erythema noted.  DP and PT pulses are 2+.  I will obtain radiograph of the right foot and ankle to rule out any bony abnormality.  Right ankle films independently reviewed and evaluated by me.  Impression: No evidence of fracture or dislocation.  Talus and motor joint is well-maintained.  Minimal soft tissue swelling present on imaging.  Radiology overread is pending. Radiology impression states no evidence of fracture or dislocation in the right ankle.  Right foot x-rays independently reviewed and evaluated by me.  Impression: There is no evidence of fracture or dislocation.  There is mild soft tissue swelling present over the midfoot.  Radiology overread is pending. Radiology impression states no acute fracture or dislocation identified about the right foot.  I will discharge patient home with a diagnosis of sprain of right foot and ankle and have her fitted with an ASO brace to help with support.  Ice, elevation, and NSAIDs to help with pain and inflammation.  Work note provided.  Home physical therapy exercises also prescribed.   Final Clinical Impressions(s) / UC Diagnoses   Final diagnoses:  Sprain of right ankle, unspecified ligament, initial encounter  Sprain of right foot, initial encounter     Discharge Instructions      Keep your ankle elevated is much as possible to help decrease swelling and aid in healing.  Apply moist heat to your ankle for 20 minutes at a time to help improve blood flow which will bring fresh oxygen and nutrients to the ligaments and help facilitate the removal of metabolic byproducts from inflammation.  Take over-the-counter ibuprofen, 600 mg (3 tablets) every 6 hours with food to help with inflammation and pain.  Wear the ASO ankle brace when up and moving.  You may take it off at nighttime, when bathing, and when not walking on her ankle.  Follow the rehabilitation exercises given  your discharge instructions.  Wait to start the phase 1 exercises until 48 hours after your injury to give time for the inflammation to go down.  Progress to phase 2 after you can complete phase 1 with out any significant pain.      ED Prescriptions   None    PDMP not reviewed this encounter.   Margarette Canada, NP 02/28/22 [phone]

## 2022-02-28 NOTE — Discharge Instructions (Addendum)
Keep your ankle elevated is much as possible to help decrease swelling and aid in healing.  Apply moist heat to your ankle for 20 minutes at a time to help improve blood flow which will bring fresh oxygen and nutrients to the ligaments and help facilitate the removal of metabolic byproducts from inflammation.  Take over-the-counter ibuprofen, 600 mg (3 tablets) every 6 hours with food to help with inflammation and pain.  Wear the ASO ankle brace when up and moving.  You may take it off at nighttime, when bathing, and when not walking on her ankle.  Follow the rehabilitation exercises given your discharge instructions.  Wait to start the phase 1 exercises until 48 hours after your injury to give time for the inflammation to go down.  Progress to phase 2 after you can complete phase 1 with out any significant pain.

## 2022-02-28 NOTE — ED Triage Notes (Signed)
Pt c/o fall, right ankle and foot pain x1day.  Pt was walking across the parking lot last night and stepped into a hole. Pt states that she twisted her ankle and landed on the lateral side of her right foot.   Pt states that the pain has come down from last night and has been taking Ibuprofen for pain.   Pt states the pain is along the arch of the right foot, the top of the foot, and the ankle.  Pt is having numbness and tingling along the top and bottom of the foot, toes, and lower leg and states that her foot does not go straight and is tilted inward toward the body since the injury.  Pt denies bruising and swelling.

## 2022-04-22 DIAGNOSIS — N3000 Acute cystitis without hematuria: Secondary | ICD-10-CM | POA: Diagnosis not present

## 2022-11-22 DIAGNOSIS — R1033 Periumbilical pain: Secondary | ICD-10-CM | POA: Diagnosis not present

## 2022-11-22 DIAGNOSIS — K429 Umbilical hernia without obstruction or gangrene: Secondary | ICD-10-CM | POA: Diagnosis not present

## 2022-11-23 DIAGNOSIS — K429 Umbilical hernia without obstruction or gangrene: Secondary | ICD-10-CM | POA: Diagnosis not present

## 2022-12-01 DIAGNOSIS — K429 Umbilical hernia without obstruction or gangrene: Secondary | ICD-10-CM | POA: Diagnosis not present

## 2022-12-01 DIAGNOSIS — G8929 Other chronic pain: Secondary | ICD-10-CM | POA: Diagnosis not present

## 2022-12-01 DIAGNOSIS — R109 Unspecified abdominal pain: Secondary | ICD-10-CM | POA: Diagnosis not present

## 2022-12-29 ENCOUNTER — Encounter: Payer: Self-pay | Admitting: Emergency Medicine

## 2022-12-29 ENCOUNTER — Ambulatory Visit
Admission: EM | Admit: 2022-12-29 | Discharge: 2022-12-29 | Disposition: A | Discharge status: Home / Self Care | Payer: Medicaid Other | Attending: Family Medicine | Admitting: Family Medicine

## 2022-12-29 DIAGNOSIS — S0501XA Injury of conjunctiva and corneal abrasion without foreign body, right eye, initial encounter: Secondary | ICD-10-CM | POA: Diagnosis not present

## 2022-12-29 MED ORDER — ERYTHROMYCIN 5 MG/GM OP OINT: TOPICAL_OINTMENT | OPHTHALMIC | 0 refills | Status: AC

## 2022-12-29 NOTE — ED Provider Notes (Addendum)
[facility]    CSN: [id number] Arrival date & time: 12/29/22  0830      History   Chief Complaint Chief Complaint  Patient presents with   eye irritation     HPI HPI  Jasmine Ballard is a 37 y.o. female.    Jasmine Ballard presents for right eye pain that started 2 days ago after being poked in the eye by a Christmas wreath while walking into her sisters home. Yesterday, it felt like something was in it.  She flushed it and wiped off her makeup.  This morning, has light sensitivity and tearing.   Jasmine Ballard does not wear glasses or contacts.  Jasmine Ballard has had some trouble seeing and watery discharge from the eye throuhgtout the day but small crusting in the morning.  However, eye pain remains.  Jasmine Ballard has otherwise been well and has no additional concerns today.   History reviewed. No pertinent past medical history.  There are no active problems to display for this patient.   Past Surgical History:  Procedure Laterality Date   CHOLECYSTECTOMY      OB History     Gravida  1   Para      Term      Preterm      AB      Living         SAB      IAB      Ectopic      Multiple      Live Births               Home Medications    Prior to Admission medications   Medication Sig Start Date End Date Taking? Authorizing Provider  erythromycin ophthalmic ointment Place a 1/2 inch ribbon of ointment into the lower eyelid. Use 3 times a day for 7 days 12/29/22  Yes Teriana Danker, Seward Meth, DO  levonorgestrel (MIRENA) 20 MCG/DAY IUD 1 each by Intrauterine route once.   Yes [provider]  cetirizine (ZYRTEC) 10 MG tablet Take by mouth.    [provider]    Family History Family History  Problem Relation Age of Onset   Rheum arthritis Mother    Fibromyalgia Mother    Anxiety disorder Mother    Healthy Father     Social History Social History   Tobacco Use   Smoking status: Never   Smokeless tobacco: Never  Vaping Use   Vaping status:  Never Used  Substance Use Topics   Alcohol use: Yes    Comment: socially   Drug use: Never     Allergies   Patient has no known allergies.   Review of Systems Review of Systems : negative unless otherwise stated in HPI.      Physical Exam Triage Vital Signs ED Triage Vitals  Encounter Vitals Group     BP      Systolic BP Percentile      Diastolic BP Percentile      Pulse      Resp      Temp      Temp src      SpO2      Weight      Height      Head Circumference      Peak Flow      Pain Score      Pain Loc      Pain Education      Exclude from Growth Chart    No data found.  Updated Vital Signs BP 118/76 (BP Location: Right Arm)   Pulse 73   Temp 98.5 F (36.9 C) (Oral)   Resp 18   SpO2 98%   Visual Acuity Right Eye Distance: 20/25 Left Eye Distance: 20/15 Bilateral Distance: 20/15 (umcorrected)  Right Eye Near:   Left Eye Near:    Bilateral Near:     Physical Exam  GEN: pleasant well appearing female, in no acute distress  NECK: normal ROM  RESP: no increased work of breathing EYES:     General: Lids are normal. Lids are everted, no foreign bodies appreciated. Vision grossly intact. Gaze aligned appropriately.        Right eye: tearing, no hordeolum or foreign body.       Left eye: No foreign body, discharge or hordeolum.     Extraocular Movements: Extraocular movements intact.     Pupils equal round and reactive to light and accommodation    Conjunctiva/sclera:  right conjunctiva is injected. No chemosis or hemorrhage.    Comments: fluorescein stain performed, Corneal abrasions in the 8 through 5-7 o'clock positions, saline rinsed and inspected for foreign bodies  SKIN: warm and dry   UC Treatments / Results  Labs (all labs ordered are listed, but only abnormal results are displayed) Labs Reviewed - No data to display  EKG   Radiology No results found.  Procedures Procedures (including critical care time)  Medications Ordered in  UC Medications - No data to display  Initial Impression / Assessment and Plan / UC Course  I have reviewed the triage vital signs and the nursing notes.  Pertinent labs & imaging results that were available during my care of the patient were reviewed by me and considered in my medical decision making (see chart for details).     Patient is a 37 y.o. female who presents after  right eye pain with watery discharge for the past  2 days.  On exam, she has a evidence of corneal abrasion on the right. Treat with erythromycin eye ointment.  Advised to follow-up with an ophthalmologist or optometrist, if  discomfort/pain is not improving after 7day course. Recommended pt pick up eye patch from the pharmacy, if desired. Understanding voiced.  Vision screen is grossly unremarkable.  Discussed MDM, treatment plan and plan for follow-up with patient who agrees with plan.  Final Clinical Impressions(s) / UC Diagnoses   Final diagnoses:  Abrasion of right cornea, initial encounter     Discharge Instructions      Stop by the pharmacy to pick up your antibiotic eye medication.  Follow up with your primary eyecare provider or [facility] if symptoms suddenly worsen or you have little improvement in your eye symptoms.       ED Prescriptions     Medication Sig Dispense Auth. Provider   erythromycin ophthalmic ointment Place a 1/2 inch ribbon of ointment into the lower eyelid. Use 3 times a day for 7 days 3.5 g Katha Cabal, DO      PDMP not reviewed this encounter.   Katha Cabal, DO 12/29/22 1011    Katha Cabal, DO 12/29/22 1011

## 2022-12-29 NOTE — Discharge Instructions (Addendum)
Stop by the pharmacy to pick up your antibiotic eye medication.  Follow up with your primary eyecare provider or [facility] if symptoms suddenly worsen or you have little improvement in your eye symptoms.    

## 2022-12-29 NOTE — ED Triage Notes (Signed)
Pt was poked in her right eye by the branches on a christmas wreath. Pt has flushed her right eye and used OTC eye drops.
